# Patient Record
Sex: Female | Born: 1955 | Race: Black or African American | Hispanic: No | Marital: Single | Smoking: Current every day smoker
Health system: Southern US, Community
[De-identification: ages and names within clinical notes are randomized; demographics above are authoritative.]

---

## 2002-04-23 ENCOUNTER — Emergency Department (HOSPITAL_COMMUNITY): Admission: EM | Admit: 2002-04-23 | Discharge: 2002-04-23 | Payer: Self-pay | Admitting: Emergency Medicine

## 2002-04-24 ENCOUNTER — Encounter: Admission: RE | Admit: 2002-04-24 | Discharge: 2002-04-24 | Payer: Self-pay | Admitting: Emergency Medicine

## 2002-04-24 ENCOUNTER — Encounter: Payer: Self-pay | Admitting: Emergency Medicine

## 2006-05-27 ENCOUNTER — Emergency Department (HOSPITAL_COMMUNITY): Admission: EM | Admit: 2006-05-27 | Discharge: 2006-05-27 | Payer: Self-pay | Admitting: Emergency Medicine

## 2006-09-05 ENCOUNTER — Emergency Department (HOSPITAL_COMMUNITY): Admission: EM | Admit: 2006-09-05 | Discharge: 2006-09-05 | Payer: Self-pay | Admitting: Emergency Medicine

## 2008-08-13 ENCOUNTER — Encounter: Admission: RE | Admit: 2008-08-13 | Discharge: 2008-08-13 | Payer: Self-pay | Admitting: Infectious Diseases

## 2008-10-20 ENCOUNTER — Ambulatory Visit: Payer: Self-pay | Admitting: Obstetrics & Gynecology

## 2008-10-20 ENCOUNTER — Other Ambulatory Visit: Admission: RE | Admit: 2008-10-20 | Discharge: 2008-10-20 | Payer: Self-pay | Admitting: Obstetrics & Gynecology

## 2008-10-21 ENCOUNTER — Encounter: Payer: Self-pay | Admitting: Obstetrics & Gynecology

## 2008-10-21 LAB — CONVERTED CEMR LAB
Chlamydia, DNA Probe: NEGATIVE
FSH: 50.1 milliintl units/mL
GC Probe Amp, Genital: NEGATIVE
HCT: 26.7 % — ABNORMAL LOW (ref 36.0–46.0)
HCV Ab: NEGATIVE
Hemoglobin: 8.4 g/dL — ABNORMAL LOW (ref 12.0–15.0)
Hepatitis B Surface Ag: NEGATIVE
MCHC: 31.5 g/dL (ref 30.0–36.0)
MCV: 85.6 fL (ref 78.0–100.0)
Platelets: 394 10*3/uL (ref 150–400)
Prolactin: 9.9 ng/mL
RBC: 3.12 M/uL — ABNORMAL LOW (ref 3.87–5.11)
RDW: 18 % — ABNORMAL HIGH (ref 11.5–15.5)
TSH: 2.161 microintl units/mL (ref 0.350–4.500)
WBC: 8.7 10*3/uL (ref 4.0–10.5)
hCG, Beta Chain, Quant, S: <2 milliintl units/mL

## 2008-10-26 ENCOUNTER — Ambulatory Visit (HOSPITAL_COMMUNITY): Admission: RE | Admit: 2008-10-26 | Discharge: 2008-10-26 | Payer: Self-pay | Admitting: Obstetrics & Gynecology

## 2010-02-28 IMAGING — US US PELVIS COMPLETE MODIFY
1 series · 14 of 25 positions shown · non-contrast
Comparison: None

CLINICAL DATA: Abnormal uterine bleeding.  Fibroids.  LMP
09/24/2008

TRANSABDOMINAL AND TRANSVAGINAL ULTRASOUND OF PELVIS
TECHNIQUE: Both transabdominal and transvaginal ultrasound
examinations of the pelvis were performed including evaluation of
the uterus, ovaries, adnexal regions, and pelvic cul-de-sac.

[Series 1: us pelvis complete modify · 0.27mm/px · 14 of 43 slices shown]
[im 1/43]
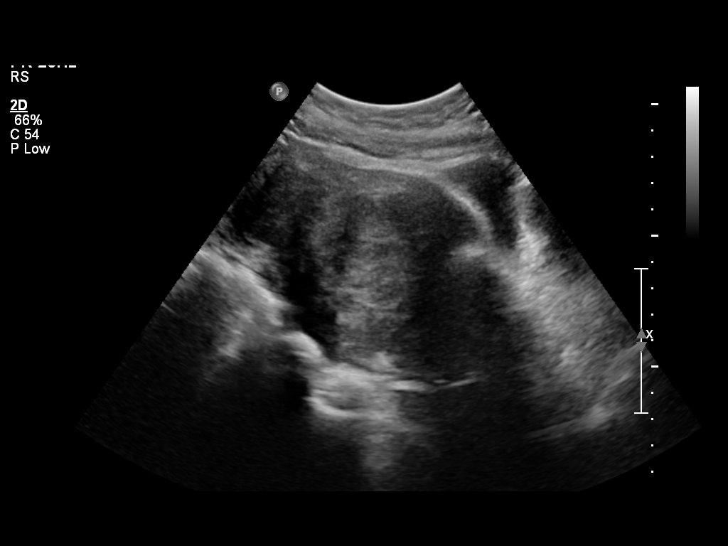
[im 4/43]
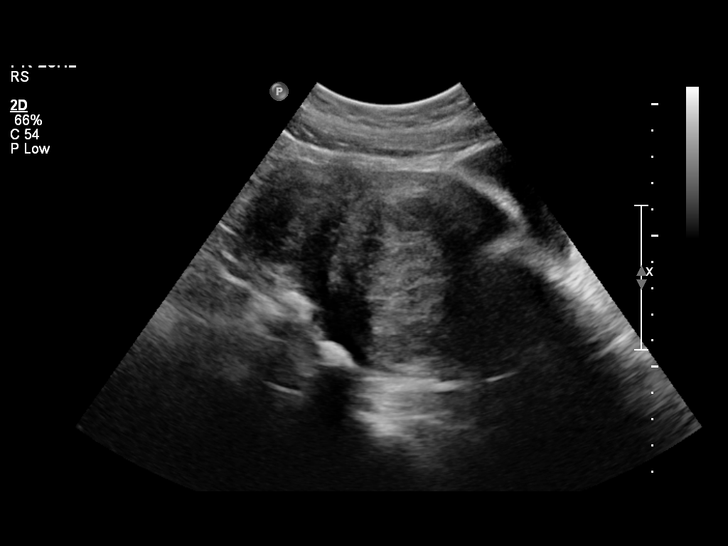
[im 8/43]
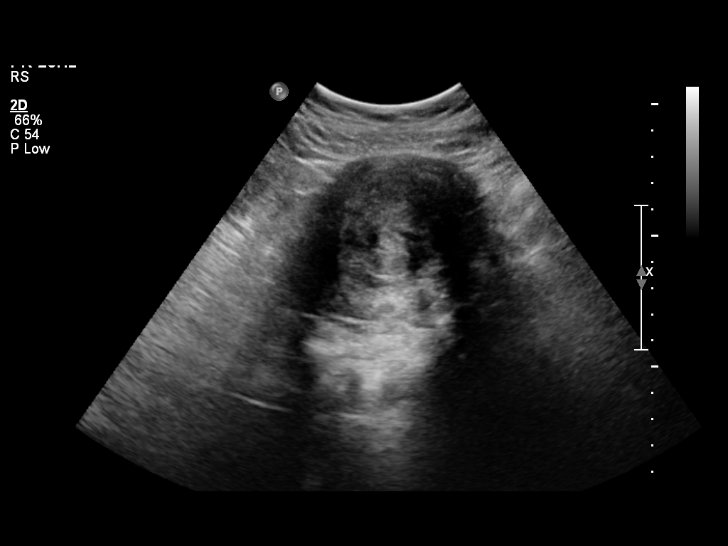
[im 11/43]
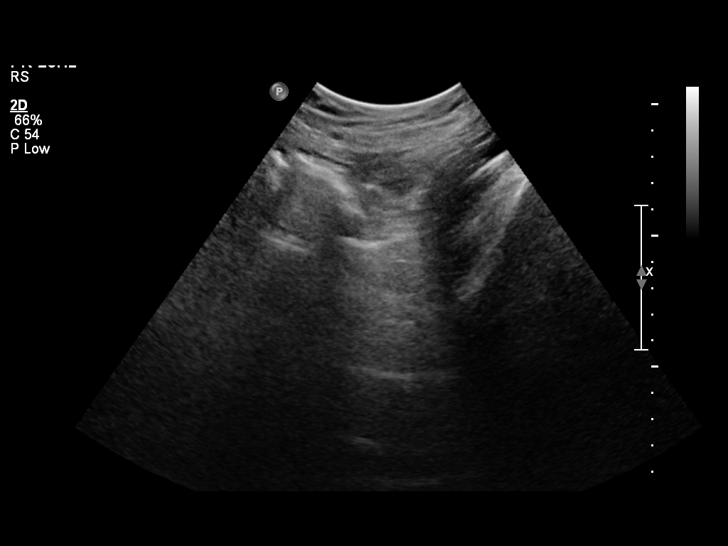
[im 15/43]
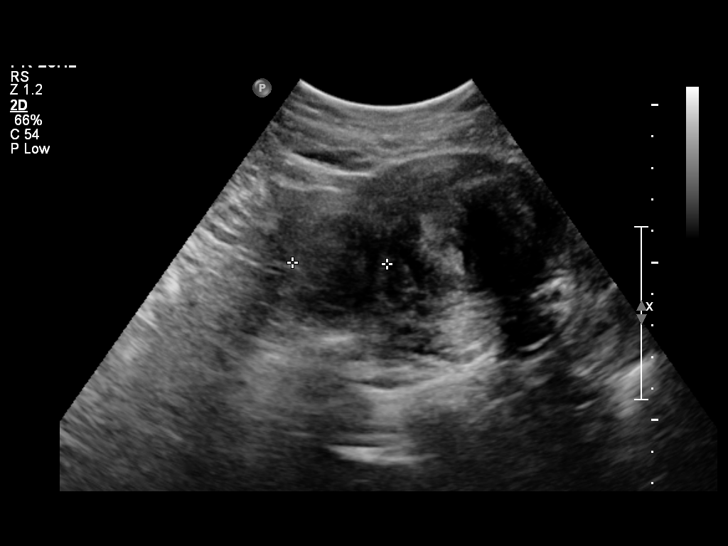
[im 16/43]
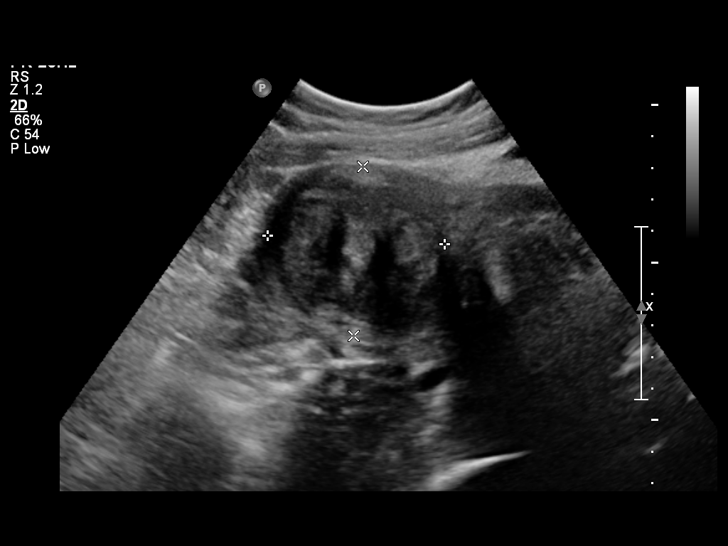
[im 20/43]
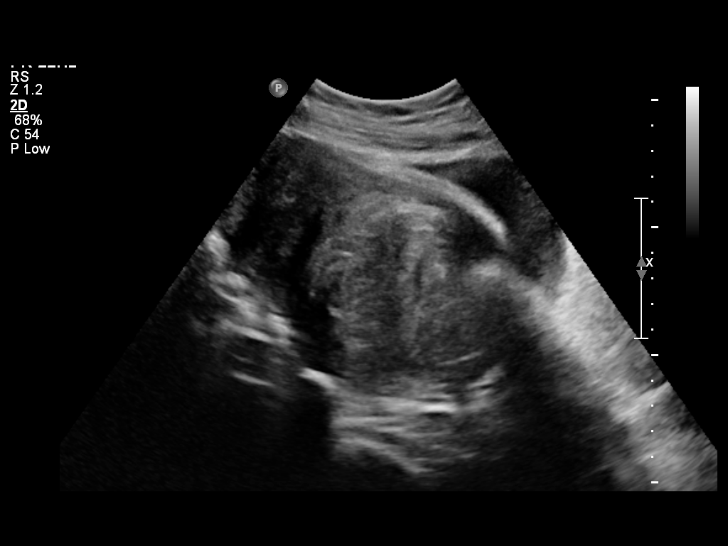
[im 23/43]
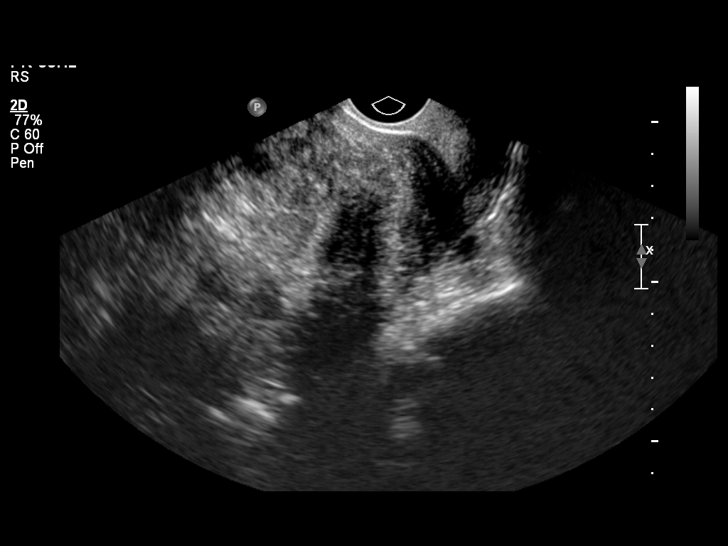
[im 27/43]
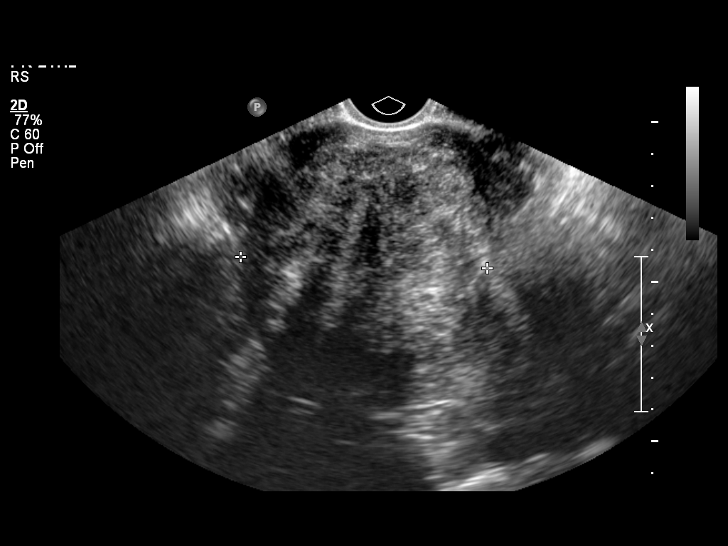
[im 29/43]
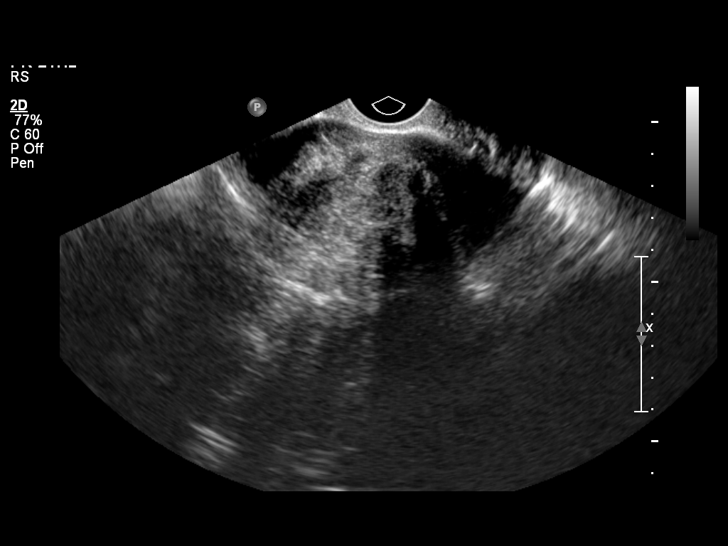
[im 32/43]
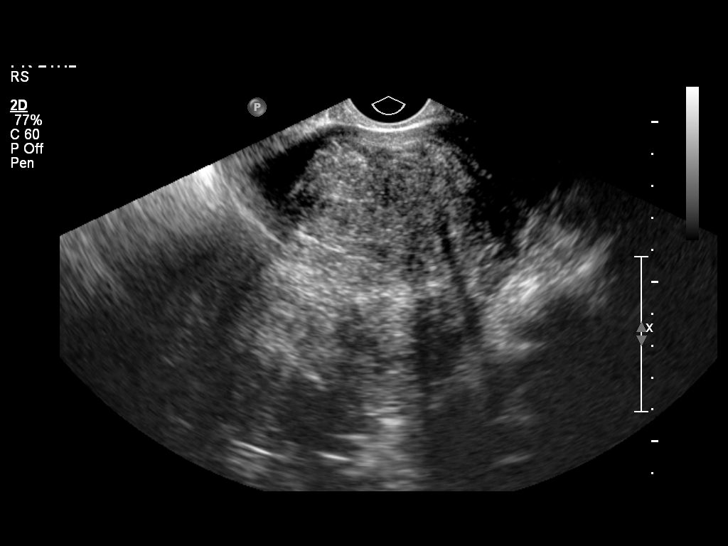
[im 36/43]
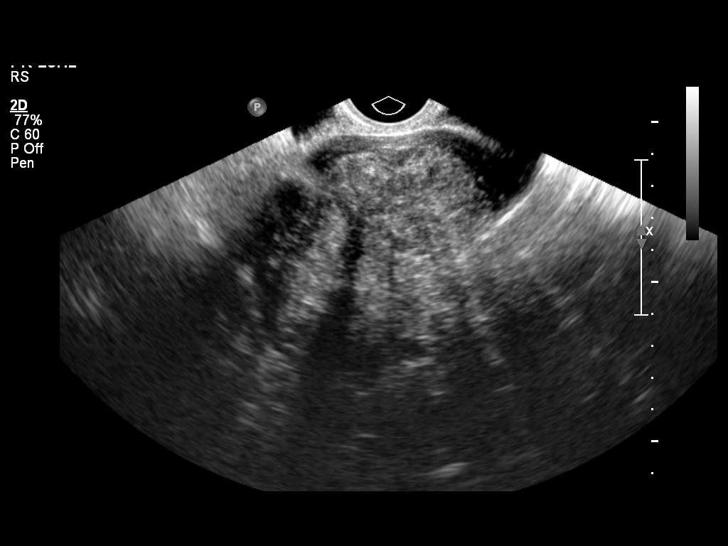
[im 39/43]
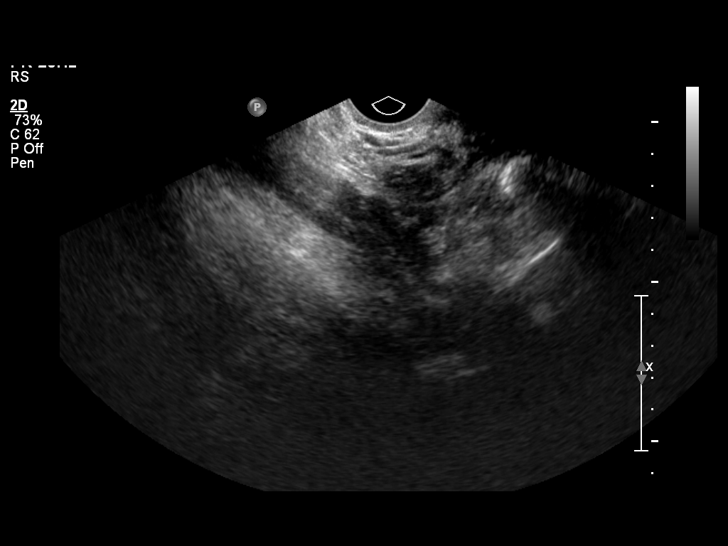
[im 43/43]
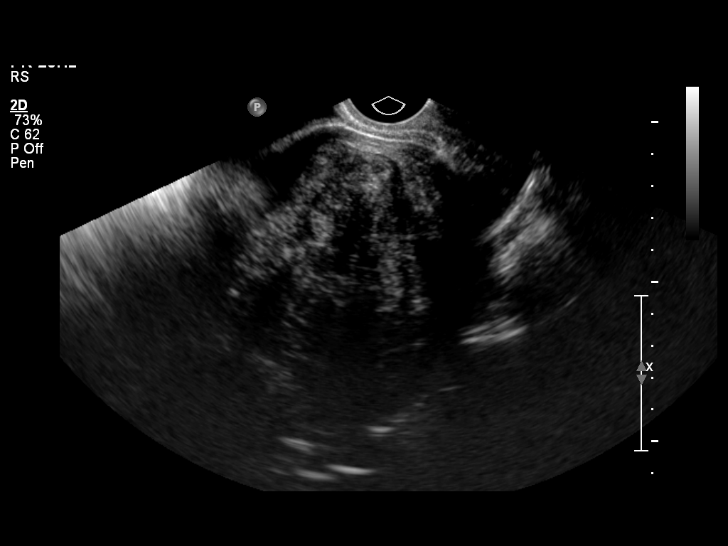

[14 of 25 positions shown; findings below may reference images not displayed]

FINDINGS: Uterus:  Overall measurements are 13.4 x 7.2 x 7.7 cm.  The uterus
is diffusely involved by fibroids, with at least three separate
distinct fibroids visualized.  These measure 3.6 cm, 6.0 cm, and
6.8 cm and maximum diameters.

Endometrium:  Not visualized due to fibroids.

Right Ovary:  Not directly visualized.

Left Ovary:  Not directly visualized.

Other Findings:  No adnexal mass or free fluid identified.
IMPRESSION: 1.  Moderately enlarged uteruswith at least three discrete
fibroids, measuring up to 6.8 cm.
2.  Nonvisualization of endometrium and ovaries due to fibroids.
No adnexal mass or free fluid identified.

## 2010-07-16 ENCOUNTER — Encounter: Payer: Self-pay | Admitting: General Surgery

## 2010-11-07 NOTE — Group Therapy Note (Signed)
Meredith Knight, Meredith Knight                 ACCOUNT NO.:  [id number]   MEDICAL RECORD NO.:  [id number]          PATIENT TYPE:  WOC   LOCATION:  [facility]                   FACILITY:  [facility]   PHYSICIAN:  Johnella Moloney, MD        DATE OF BIRTH:  [birth year]   DATE OF SERVICE:  10/20/2008                                  CLINIC NOTE   CHIEF COMPLAINT:  Menometrorrhagia.   HISTORY OF PRESENT ILLNESS:  The patient is a 55 year old gravida 3,  para 1-0-2-1 who was referred from the [facility]  for evaluation of menometrorrhagia and enlarged uterus.  The patient  reports menarche at age 78 with regular cycles until 6 months ago when  she started having the episode of having 5 days of very heavy bleeding  and very big clots and no bleeding for a couple of days, then light  bleeding and then another episode of heavy bleeding which could come at  irregular intervals.  She said she does have an episode of heavy  bleeding at least two to three times a month since the last 6 months.  Her bleeding is associated with abdominal cramping and pain.  She has  also noted hot flashes and night sweats which have become increasingly  bothersome.  The patient also reports having some dizziness, denies any  syncopal episodes, or any other symptoms.  The patient has not had any  laboratory, radiologic or any other evaluation for this bleeding.  [facility] and was  normal according to the patient.   PAST OBSTETRIC/GYNECOLOGIC HISTORY:  The patient has had one vaginal  delivery, one miscarriage and one termination.  Menstrual history is as  above.  The patient does have a history of Trichomonas that was recently  diagnosed and treated and according to her she was tested again for it  and was negative.  The patient denies any abnormal Pap smear history.  Her last Pap smear was in February 2010 and her last mammogram was also  in February 2010  and was normal.   PAST MEDICAL HISTORY:  Bilateral breast acid abscesses that needed to be  drained.   SURGICAL HISTORY:  Drainage of breast abscess of the breast center at  [facility].   MEDICATIONS:  None.   ALLERGIES:  No known drug allergies.  The patient is not allergic to  latex.   SOCIAL HISTORY:  The patient lives with her sister.  She is currently  unemployed.  She smokes one pack of cigarettes per week and has smoked  for 25 years.  She drinks four beers a week.  She denies any illicit or  IV drug use.  She also denies any current or past history of sexual  physical abuse.   FAMILIAL HISTORY:  Entirely negative.  No gynecologic or breast cancer.   REVIEW OF SYSTEMS:  The patient endorses bruising, night sweats, weight  gain, frequent headaches, dizzy spells, nausea, vomiting, rare loss of  urine with coughing and sneezing and hot flashes.   PHYSICAL EXAMINATION:  Temperature 97.4, pulse 73, respirations 16,  blood pressure 133/85, weight 171 pounds, height 62 inches.  IN GENERAL:  No apparent distress.  LUNGS:  Clear to auscultation bilaterally.  HEART:  Regular rate and rhythm.  ABDOMEN:  Soft, fibroid uterus palpated at umbilicus.  EXTREMITIES:  No cyanosis, clubbing or edema.  PELVIC EXAM:  Normal external female genitalia.  Pink vagina with some  loss of rugae.  Small amount of old blood noted in vaginal vault.  Cervix is multiparous.  On bimanual exam the patient has about a 20-week  size fibroid uterus, small amount of tenderness on palpation.  Adnexa  are unable to be palpated.   Endometrial biopsy:  The patient was counseled regarding need for  endometrial biopsy given her menometrorrhagia and her age.  The risks of  biopsy were reviewed and written informed consent was obtained.  A urine  pregnancy test that was done was negative.   During the pelvic examination the patient initially had a swab obtained  for gonorrhea and chlamydia as per the patient's  request for a full STD  screen given her recent diagnosis of Trichomonas.  Her cervix was then  swabbed with Betadine x2 and a tenaculum was placed in the anterior  cervical lip.  A 20 mm Pipelle was then advanced into the uterine  fundus.  I was unable to advance it past 7 mm secondary to probably a  fibroid obstruction.  A suction was then created in the Pipelle and it  was slowly rotated to obtain some endometrial tissue.  Moderate  endometrial tissue  was obtained.  Only one pass was made and the tissue  was sent off to Pathology.  There was minimal amount of bright red blood  at the end of the biopsy and the patient tolerated procedure well.  All  instruments were removed from the patient's pelvis.   ASSESSMENT/PLAN:  The patient is a 55 year old gravida 3, para 1-0-2-1  here for menometrorrhagia.  The patient has an enlarged uterus on  examination.  Most likely fibroids, but we will get an ultrasound for  further evaluation.  The patient will also have tested her TSH,  prolactin and FSH levels given her symptoms and also for further workup  for her menometrorrhagia.  The patient also requests a full sexually  transmitted disease screen, which will be done at the same time to  evaluate for HIV, hepatitis B, hepatitis C and RPR.  The patient will  follow up in 2 weeks for evaluation of all these results and results of  the endometrial biopsy.  At this visit we will proceed with management  for her menorrhagia of course based on her ultrasound findings.  The  possible modalities that will be offered to the patient include ablation  if she is a candidate for that or hysterectomy.           ______________________________  Johnella Moloney, MD     UD/MEDQ  D:  10/20/2008  T:  10/20/2008  Job:  [id number]

## 2010-11-10 NOTE — Consult Note (Signed)
Meredith Knight, Meredith Knight                 ACCOUNT NO.:  [id number]   MEDICAL RECORD NO.:  [id number]          PATIENT TYPE:  EMS   LOCATION:  ED                           FACILITY:  [facility]   PHYSICIAN:  Angelia Mould. Derrell Lolling, M.D.DATE OF BIRTH:  01/07/1956   DATE OF CONSULTATION:  09/05/2006  DATE OF DISCHARGE:  09/05/2006                                 CONSULTATION   REASON FOR CONSULTATION:  Evaluate right breast infection.   HISTORY OF PRESENT ILLNESS:  This is a 55 year old black female who  states that she has had pain and swelling in her right breast for 2  weeks, but has not sought medical attention until today.  It began to  drain yellow foul-smelling fluid, about 36-48 hours ago.  She denies any  history of trauma to this breast or any prior problems with this breast.  She went to [facility] today.  I was called.  She was transferred  to the [facility] emergency department for my evaluation.  She states  that she has not had any mammograms in several years.   PAST HISTORY:  She had a left breast abscess 3 or 4 months ago,  underwent incision and drainage possibly in the [facility] office,  but she does not remember the doctor's name.  She had some packing and  it healed completely, and that breast feels fine now.  She states that  she is healthy.  She denies having hypertension, diabetes or sickle cell  disease.   MEDICATIONS:  None.   DRUG ALLERGIES:  NONE.   SOCIAL HISTORY:  She states that she used to be addicted to cocaine  but is not now.  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  Does smoke cigarettes, drinks alcohol  occasionally.   FAMILY HISTORY:  Noncontributory.   REVIEW OF SYSTEMS:  All systems are reviewed.  A 15-system review  noncontributory, except as described above.   PHYSICAL EXAMINATION:  GENERAL:  Alert, oriented but very nervous,  anxious and fidgety and poorly cooperative black female in mild  distress.  VITAL SIGNS:  Temp 97.9, blood pressure 114/68, heart rate 84 and  regular, respiratory rate 18.  NECK:  No adenopathy or mass.  No jugular distension.  LUNGS:  Clear to auscultation.  No chest wall tenderness.  BREASTS:  The left breast shows some scars in the periareolar area which  are soft and well-healed, no tenderness.  No breast mass on the left.  Right breast shows an area of necrotic skin at the areolar margin,  medially at the 3 o'clock position.  There is about a 5-cm area of  induration and low grade cellulitis around this.  There is no purulence  that I can detect.  I do not detect a specific mass, other than the  induration.   PROCEDURE:  The right breast was prepped with Betadine and draped out.  One percent Xylocaine with epinephrine was used as a local infiltration  anesthetic.  I debrided the area of necrotic skin at the  areolar margin  medially, approximately 2 cm in diameter.  I then digitally explored the  wound and found a chronic abscess cavity.  This was cultured.  I washed  the wound out with saline.  I did not feel that any further debridement  was necessary.  I packed the wound loosely with 1/2-inch Iodoform gauze.  A clean bandage was placed.  The patient tolerated the procedure well  but was somewhat tearful.   DATA:  A CBC reveals a white blood cell count of 8,900 and hemoglobin of  9.7.   ASSESSMENT:  1. Right breast abscess, periareolar, the 3 o'clock position, delayed      presentation somewhat neglected.  2. History of left breast abscess, healed.  3. History of cocaine abuse, per the patient's history.  4. Tobacco abuse.  5. Anxiety.  6. Anemia.   PLAN:  1. The patient was advised in wound care, which is to remove the      packing tomorrow morning and to take a shower twice a day and to      cover externally with dry white gauze.  No ointments or salves.  2. Doxycycline 100 mg p.o. b.i.d. x10 days.  3. Vicodin 20 tablets p.r.n.  pain.  4. She is advised to return to see me in the office in 10 to 14 days.  5. She is advised that she must have mammograms to rule out other      breast disease, once this is healed.  She expresses understanding      of all this and states that she will comply.      Angelia Mould. Derrell Lolling, M.D.  Electronically Signed     HMI/MEDQ  D:  09/05/2006  T:  09/07/2006  Job:  [id number]

## 2014-10-11 ENCOUNTER — Emergency Department (HOSPITAL_COMMUNITY)
Admission: EM | Admit: 2014-10-11 | Discharge: 2014-10-12 | Disposition: A | Discharge status: Home / Self Care | Payer: Self-pay | Attending: Emergency Medicine | Admitting: Emergency Medicine

## 2014-10-11 ENCOUNTER — Encounter (HOSPITAL_COMMUNITY): Payer: Self-pay | Admitting: Emergency Medicine

## 2014-10-11 DIAGNOSIS — K2970 Gastritis, unspecified, without bleeding: Secondary | ICD-10-CM | POA: Insufficient documentation

## 2014-10-11 DIAGNOSIS — Z3202 Encounter for pregnancy test, result negative: Secondary | ICD-10-CM | POA: Insufficient documentation

## 2014-10-11 DIAGNOSIS — R197 Diarrhea, unspecified: Secondary | ICD-10-CM

## 2014-10-11 DIAGNOSIS — K529 Noninfective gastroenteritis and colitis, unspecified: Secondary | ICD-10-CM | POA: Insufficient documentation

## 2014-10-11 DIAGNOSIS — E876 Hypokalemia: Secondary | ICD-10-CM | POA: Insufficient documentation

## 2014-10-11 DIAGNOSIS — Z720 Tobacco use: Secondary | ICD-10-CM | POA: Insufficient documentation

## 2014-10-11 DIAGNOSIS — R112 Nausea with vomiting, unspecified: Secondary | ICD-10-CM

## 2014-10-11 DIAGNOSIS — R1013 Epigastric pain: Secondary | ICD-10-CM

## 2014-10-11 LAB — CBC WITH DIFFERENTIAL/PLATELET
BASOS PCT: 0 % (ref 0–1)
Basophils Absolute: 0 10*3/uL (ref 0.0–0.1)
EOS ABS: 0 10*3/uL (ref 0.0–0.7)
Eosinophils Relative: 0 % (ref 0–5)
HEMATOCRIT: 37.5 % (ref 36.0–46.0)
Hemoglobin: 12 g/dL (ref 12.0–15.0)
Lymphocytes Relative: 13 % (ref 12–46)
Lymphs Abs: 1.2 10*3/uL (ref 0.7–4.0)
MCH: 28.1 pg (ref 26.0–34.0)
MCHC: 32 g/dL (ref 30.0–36.0)
MCV: 87.8 fL (ref 78.0–100.0)
MONO ABS: 0.6 10*3/uL (ref 0.1–1.0)
MONOS PCT: 6 % (ref 3–12)
NEUTROS PCT: 81 % — AB (ref 43–77)
Neutro Abs: 7.4 10*3/uL (ref 1.7–7.7)
Platelets: 306 10*3/uL (ref 150–400)
RBC: 4.27 MIL/uL (ref 3.87–5.11)
RDW: 16.3 % — AB (ref 11.5–15.5)
WBC: 9.1 10*3/uL (ref 4.0–10.5)

## 2014-10-11 LAB — URINE MICROSCOPIC-ADD ON

## 2014-10-11 LAB — COMPREHENSIVE METABOLIC PANEL
ALBUMIN: 4.1 g/dL (ref 3.5–5.2)
ALK PHOS: 106 U/L (ref 39–117)
ALT: 18 U/L (ref 0–35)
AST: 21 U/L (ref 0–37)
Anion gap: 11 (ref 5–15)
BUN: 11 mg/dL (ref 6–23)
CALCIUM: 9.8 mg/dL (ref 8.4–10.5)
CO2: 26 mmol/L (ref 19–32)
Chloride: 100 mmol/L (ref 96–112)
Creatinine, Ser: 0.88 mg/dL (ref 0.50–1.10)
GFR calc Af Amer: 83 mL/min — ABNORMAL LOW (ref >90)
GFR calc non Af Amer: 72 mL/min — ABNORMAL LOW (ref >90)
GLUCOSE: 128 mg/dL — AB (ref 70–99)
POTASSIUM: 3.4 mmol/L — AB (ref 3.5–5.1)
SODIUM: 137 mmol/L (ref 135–145)
Total Bilirubin: 0.9 mg/dL (ref 0.3–1.2)
Total Protein: 8.6 g/dL — ABNORMAL HIGH (ref 6.0–8.3)

## 2014-10-11 LAB — URINALYSIS, ROUTINE W REFLEX MICROSCOPIC
Bilirubin Urine: NEGATIVE
GLUCOSE, UA: NEGATIVE mg/dL
Ketones, ur: 40 mg/dL — AB
Nitrite: NEGATIVE
PROTEIN: 30 mg/dL — AB
SPECIFIC GRAVITY, URINE: 1.021 (ref 1.005–1.030)
Urobilinogen, UA: 1 mg/dL (ref 0.0–1.0)
pH: 8.5 — ABNORMAL HIGH (ref 5.0–8.0)

## 2014-10-11 LAB — POC URINE PREG, ED: PREG TEST UR: NEGATIVE

## 2014-10-11 LAB — LIPASE, BLOOD: Lipase: 22 U/L (ref 11–59)

## 2014-10-11 LAB — I-STAT TROPONIN, ED: TROPONIN I, POC: 0 ng/mL (ref 0.00–0.08)

## 2014-10-11 MED ORDER — MORPHINE SULFATE 4 MG/ML IJ SOLN
4.0000 mg | Freq: Once | INTRAMUSCULAR | Status: AC
  Administered 2014-10-11: 4 mg via INTRAVENOUS
  Filled 2014-10-11: qty 1

## 2014-10-11 MED ORDER — GI COCKTAIL ~~LOC~~
30.0000 mL | Freq: Once | ORAL | Status: AC
  Administered 2014-10-11: 30 mL via ORAL
  Filled 2014-10-11: qty 30

## 2014-10-11 MED ORDER — PANTOPRAZOLE SODIUM 40 MG IV SOLR
40.0000 mg | Freq: Once | INTRAVENOUS | Status: AC
  Administered 2014-10-11: 40 mg via INTRAVENOUS
  Filled 2014-10-11: qty 40

## 2014-10-11 MED ORDER — ONDANSETRON HCL 4 MG/2ML IJ SOLN
4.0000 mg | Freq: Once | INTRAMUSCULAR | Status: AC
  Administered 2014-10-11: 4 mg via INTRAVENOUS
  Filled 2014-10-11: qty 2

## 2014-10-11 MED ORDER — SODIUM CHLORIDE 0.9 % IV BOLUS (SEPSIS)
500.0000 mL | Freq: Once | INTRAVENOUS | Status: AC
  Administered 2014-10-11: 500 mL via INTRAVENOUS

## 2014-10-11 MED ORDER — POTASSIUM CHLORIDE CRYS ER 20 MEQ PO TBCR
40.0000 meq | EXTENDED_RELEASE_TABLET | Freq: Once | ORAL | Status: AC
  Administered 2014-10-12: 40 meq via ORAL
  Filled 2014-10-11: qty 2

## 2014-10-11 NOTE — ED Notes (Addendum)
Pt states diarrhea, nausea and a "pulling" in epigastric area since yesterday. Reports taking tylenol and mylanta with no relief.  Pt not actively vomiting at this time. Pt alert, oriented, nad.

## 2014-10-11 NOTE — ED Notes (Signed)
Pt. reports intermittent epigastric pain with emesis and diarrhea onset yesterday , denies fever or chills, no SOB or diaphoresis .

## 2014-10-11 NOTE — ED Provider Notes (Signed)
CSN: [id number]     Arrival date & time 10/11/14  1912 History   First MD Initiated Contact with Patient 10/11/14 2228     Chief Complaint  Patient presents with  . Abdominal Pain     (Consider location/radiation/quality/duration/timing/severity/associated sxs/prior Treatment) HPI Comments: Meredith Knight is a 59 y.o. Female who presents to the ED with complaints of epigastric pain 1 day. She describes the pain as 7/10 squeezing/pulling pain located in the epigastrium, nonradiating, constant, with no known aggravating factors, and unrelieved with Mylanta and Tylenol. She endorses associated nausea and vomiting, stating that she had 3 episodes of nonbloody nonbilious emesis today and 6 episodes yesterday. Additionally she states she had 2 episodes of watery diarrhea yesterday and none today. She denies any fevers, chills, chest pain, shortness breath, constipation, obstipation, melena, hematochezia, hematemesis, dysuria, hematuria, increased urinary frequency, flank pain, vaginal discharge, numbness, tingling, weakness, rashes, myalgias, arthralgias, suspicious food intake, recent travel, antibiotic use, or chronic NSAIDs use. She admits to having sick contacts at home, stating that her brother has similar symptoms. She also admits to drinking two 40's of beer 2 days ago, and states she drinks "every 3 days or so". She is currently on her menses. Denies hx of nephrolithiasis, GI conditions, or cardiac disease.   Patient is a 59 y.o. female presenting with abdominal pain. The history is provided by the patient. No language interpreter was used.  Abdominal Pain Pain location:  Epigastric Pain quality: squeezing   Pain radiates to:  Does not radiate Pain severity:  Moderate Onset quality:  Gradual Duration:  1 day Timing:  Constant Progression:  Unchanged Chronicity:  New Context: sick contacts   Context: not recent illness, not recent travel and not suspicious food intake   Relieved by:   Nothing Worsened by:  Nothing tried Ineffective treatments:  Antacids and acetaminophen (mylanta and tylenol) Associated symptoms: diarrhea (2x yesterday, watery), nausea, vaginal bleeding (on menses) and vomiting (3x today, 6x yesterday, NBNB)   Associated symptoms: no chest pain, no chills, no constipation, no dysuria, no fever, no flatus, no hematemesis, no hematochezia, no hematuria, no melena, no shortness of breath and no vaginal discharge   Diarrhea:    Quality:  Watery   Number of occurrences:  2x yesterday, none today   Severity:  Mild   Duration:  1 day   Timing:  Intermittent   Progression:  Unchanged Vomiting:    Quality:  Stomach contents   Number of occurrences:  6x yesterday, 3x today   Severity:  Moderate   Duration:  1 day   Timing:  Constant   Progression:  Unchanged Risk factors: alcohol abuse (drinks "every 3 days")   Risk factors: no NSAID use     History reviewed. No pertinent past medical history. History reviewed. No pertinent past surgical history. No family history on file. History  Substance Use Topics  . Smoking status: Current Every Day Smoker  . Smokeless tobacco: Not on file  . Alcohol Use: Yes   OB History    No data available     Review of Systems  Constitutional: Negative for fever and chills.  Respiratory: Negative for shortness of breath.   Cardiovascular: Negative for chest pain.  Gastrointestinal: Positive for nausea, vomiting (3x today, 6x yesterday, NBNB), abdominal pain and diarrhea (2x yesterday, watery). Negative for constipation, blood in stool, melena, hematochezia, flatus and hematemesis.  Genitourinary: Positive for vaginal bleeding (on menses). Negative for dysuria, frequency, hematuria, flank pain, vaginal discharge and menstrual  problem.  Musculoskeletal: Negative for myalgias, back pain and arthralgias.  Skin: Negative for rash.  Allergic/Immunologic: Negative for immunocompromised state.  Neurological: Negative for  weakness and numbness.  Psychiatric/Behavioral: Negative for confusion.   10 Systems reviewed and are negative for acute change except as noted in the HPI.    Allergies  Review of patient's allergies indicates no known allergies.  Home Medications   Prior to Admission medications   Not on File   BP 143/88 mmHg  Pulse 70  Temp(Src) 98.2 F (36.8 C) (Oral)  Resp 16  Ht  (1.575 m)  Wt 164 lb 1.6 oz (74.435 kg)  BMI 30.01 kg/m2  SpO2 99%  LMP 10/08/2014 Physical Exam  Constitutional: She is oriented to person, place, and time. Vital signs are normal. She appears well-developed and well-nourished.  Non-toxic appearance. No distress.  Afebrile, nontoxic, NAD  HENT:  Head: Normocephalic and atraumatic.  Mouth/Throat: Oropharynx is clear and moist. Mucous membranes are dry (mildly).  Mildly dry mucous membranes  Eyes: Conjunctivae and EOM are normal. Right eye exhibits no discharge. Left eye exhibits no discharge.  Neck: Normal range of motion. Neck supple.  Cardiovascular: Normal rate, regular rhythm, normal heart sounds and intact distal pulses.  Exam reveals no gallop and no friction rub.   No murmur heard. Pulmonary/Chest: Effort normal and breath sounds normal. No respiratory distress. She has no decreased breath sounds. She has no wheezes. She has no rhonchi. She has no rales.  Abdominal: Soft. Normal appearance and bowel sounds are normal. She exhibits no distension. There is tenderness in the epigastric area. There is no rigidity, no rebound, no guarding, no CVA tenderness, no tenderness at McBurney's point and negative Murphy's sign.    Soft, nondistended, +BS throughout, with epigastric TTP, no r/g/r, neg murphy's, neg mcburney's, no CVA TTP   Musculoskeletal: Normal range of motion.  Neurological: She is alert and oriented to person, place, and time. She has normal strength. No sensory deficit.  Skin: Skin is warm, dry and intact. No rash noted.  Psychiatric: She  has a normal mood and affect.  Nursing note and vitals reviewed.   ED Course  Procedures (including critical care time) Labs Review Labs Reviewed  CBC WITH DIFFERENTIAL/PLATELET - Abnormal; Notable for the following:    RDW 16.3 (*)    Neutrophils Relative % 81 (*)    All other components within normal limits  COMPREHENSIVE METABOLIC PANEL - Abnormal; Notable for the following:    Potassium 3.4 (*)    Glucose, Bld 128 (*)    Total Protein 8.6 (*)    GFR calc non Af Amer 72 (*)    GFR calc Af Amer 83 (*)    All other components within normal limits  URINALYSIS, ROUTINE W REFLEX MICROSCOPIC - Abnormal; Notable for the following:    Color, Urine AMBER (*)    APPearance TURBID (*)    pH 8.5 (*)    Hgb urine dipstick LARGE (*)    Ketones, ur 40 (*)    Protein, ur 30 (*)    Leukocytes, UA SMALL (*)    All other components within normal limits  URINE MICROSCOPIC-ADD ON - Abnormal; Notable for the following:    Squamous Epithelial / LPF MANY (*)    Bacteria, UA FEW (*)    Crystals TRIPLE PHOSPHATE CRYSTALS (*)    All other components within normal limits  LIPASE, BLOOD  I-STAT TROPOININ, ED  POC URINE PREG, ED    Imaging  Review No results found.   EKG Interpretation   Date/Time:  Monday October 11 2014 23:59:00 EDT Ventricular Rate:  61 PR Interval:  127 QRS Duration: 87 QT Interval:  403 QTC Calculation: 406 R Axis:   37 Text Interpretation:  Sinus rhythm Borderline T abnormalities, diffuse  leads Since last tracing T wave abnormality have mostly resolved  Borderline ECG Confirmed by MILLER  MD, BRIAN ([id number]) on 10/12/2014  12:20:38 AM       EKG interpretation (#1) Date/Time:  Monday October 11 2014 19:25:51 EDT Ventricular Rate:  67 PR Interval:  122 QRS Duration: 82 QT Interval:  372 QTC Calculation: 393 R Axis:   43 Text Interpretation:  Normal sinus rhythm ST \\T \ T wave abnormality,  consider inferior ischemia Abnormal ECG Since last tracing Nonspecific T    wave abnormality now prsent. Confirmed by Hyacinth Meeker  MD, BRIAN ([id number]) on  10/11/2014 10:32:48 PM  MDM   Final diagnoses:  Epigastric abdominal pain  Gastroenteritis  Gastritis  Nausea vomiting and diarrhea  Hypokalemia    59 y.o. female here with epigastric pain, n/v/d x1 day. +Sick contacts at home. U/A contaminated (pt on menses), no symptoms of UTI, likely just contaminated catch. Upreg neg. Trop neg. CBC w/diff unremarkable. CMP showing mildly low K at 3.4, will give kdur once pt tolerating PO. Lipase WNL. EKG showing new T wave flattening inferiorly which is new when compared to 2012 EKG. Will proceed with fluids, zofran, morphine, protonix, and GI cocktail. Doubt need for imaging at this time, likely viral. Will repeat EKG once pt more comfortable. Will reassess shortly.   11:56 PM Pain improved, nausea improved. Will proceed with PO challenge, and give Kdur here. Will repeat EKG now that pt is more comfortable.   12:13 AM Repeat EKG improved, T changes improved.  12:42 AM Pt feeling better, tolerating PO well. Will send home with prilosec, zofran, and small supply of norco. Discussed diet modifications for GERD/reflux. Discussed BRAT diet. Will have her f/up with [facility] in 1wk. I explained the diagnosis and have given explicit precautions to return to the ER including for any other new or worsening symptoms. The patient understands and accepts the medical plan as it's been dictated and I have answered their questions. Discharge instructions concerning home care and prescriptions have been given. The patient is STABLE and is discharged to home in good condition.  BP 155/89 mmHg  Pulse 92  Temp(Src) 98.2 F (36.8 C) (Oral)  Resp 22  Ht 5\' 2"  (1.575 m)  Wt 164 lb 1.6 oz (74.435 kg)  BMI 30.01 kg/m2  SpO2 99%  LMP 10/08/2014  Meds ordered this encounter  Medications  . pantoprazole (PROTONIX) injection 40 mg    Sig:   . ondansetron (ZOFRAN) injection 4 mg    Sig:   . morphine  4 MG/ML injection 4 mg    Sig:   . sodium chloride 0.9 % bolus 500 mL    Sig:   . gi cocktail (Maalox,Lidocaine,Donnatal)    Sig:   . potassium chloride SA (K-DUR,KLOR-CON) CR tablet 40 mEq    Sig:   . ondansetron (ZOFRAN) 8 MG tablet    Sig: Take 1 tablet (8 mg total) by mouth every 8 (eight) hours as needed for nausea or vomiting.    Dispense:  10 tablet    Refill:  0  . HYDROcodone-acetaminophen (NORCO) 5-325 MG per tablet    Sig: Take 1 tablet by mouth every 6 (six) hours as needed for  severe pain.    Dispense:  6 tablet    Refill:  0  . omeprazole (PRILOSEC) 20 MG capsule    Sig: Take 1 capsule (20 mg total) by mouth daily.    Dispense:  30 capsule    Refill:  0     Miliani Deike Camprubi-Soms, PA-C 10/12/14 0043  Eber Hong, MD 10/13/14 [phone]

## 2014-10-12 MED ORDER — ONDANSETRON HCL 8 MG PO TABS: 8.0000 mg | ORAL_TABLET | Freq: Three times a day (TID) | ORAL | Status: AC | PRN

## 2014-10-12 MED ORDER — HYDROCODONE-ACETAMINOPHEN 5-325 MG PO TABS: 1.0000 | ORAL_TABLET | Freq: Four times a day (QID) | ORAL | Status: AC | PRN

## 2014-10-12 MED ORDER — OMEPRAZOLE 20 MG PO CPDR: 20.0000 mg | DELAYED_RELEASE_CAPSULE | Freq: Every day | ORAL | Status: AC

## 2014-10-12 NOTE — Discharge Instructions (Signed)
Use zofran as prescribed, as needed for nausea. Stay well hydrated with small sips of fluids throughout the day. Use norco as directed as needed for pain, but don't drive while taking it. Avoid NSAIDs like ibuprofen or aleve. Avoid spicy or acidic foods. Stay upright for 30 minutes after meals, don't lay down immediately after eating. Start taking prilosec as directed for indigestion, you may use zantac or tums over-the-counter for additional relief. Follow a BRAT (banana-rice-applesauce-toast) diet as described below for the next 24-48 hours. The 'BRAT' diet is suggested, then progress to diet as tolerated as symptoms abate. Call if bloody stools, persistent diarrhea, vomiting, fever or abdominal pain. Follow up with [place] [facility] in 1 week for recheck of symptoms. Return to ER for changing or worsening of symptoms.  Food Choices to Help Relieve Diarrhea When you have diarrhea, the foods you eat and your eating habits are very important. Choosing the right foods and drinks can help relieve diarrhea. Also, because diarrhea can last up to 7 days, you need to replace lost fluids and electrolytes (such as sodium, potassium, and chloride) in order to help prevent dehydration.  WHAT GENERAL GUIDELINES DO I NEED TO FOLLOW?  Slowly drink 1 cup (8 oz) of fluid for each episode of diarrhea. If you are getting enough fluid, your urine will be clear or pale yellow.  Eat starchy foods. Some good choices include white rice, white toast, pasta, low-fiber cereal, baked potatoes (without the skin), saltine crackers, and bagels.  Avoid large servings of any cooked vegetables.  Limit fruit to two servings per day. A serving is  cup or 1 small piece.  Choose foods with less than 2 g of fiber per serving.  Limit fats to less than 8 tsp (38 g) per day.  Avoid fried foods.  Eat foods that have probiotics in them. Probiotics can be found in certain dairy products.  Avoid foods and beverages that may  increase the speed at which food moves through the stomach and intestines (gastrointestinal tract). Things to avoid include:  High-fiber foods, such as dried fruit, raw fruits and vegetables, nuts, seeds, and whole grain foods.  Spicy foods and high-fat foods.  Foods and beverages sweetened with high-fructose corn syrup, honey, or sugar alcohols such as xylitol, sorbitol, and mannitol. WHAT FOODS ARE RECOMMENDED? Grains White rice. White, [place], or pita breads (fresh or toasted), including plain rolls, buns, or bagels. White pasta. Saltine, soda, or graham crackers. Pretzels. Low-fiber cereal. Cooked cereals made with water (such as cornmeal, farina, or cream cereals). Plain muffins. Matzo. Melba toast. Zwieback.  Vegetables Potatoes (without the skin). Strained tomato and vegetable juices. Most well-cooked and canned vegetables without seeds. Tender lettuce. Fruits Cooked or canned applesauce, apricots, cherries, fruit cocktail, grapefruit, peaches, pears, or plums. Fresh bananas, apples without skin, cherries, grapes, cantaloupe, grapefruit, peaches, oranges, or plums.  Meat and Other Protein Products Baked or boiled chicken. Eggs. Tofu. Fish. Seafood. Smooth peanut butter. Ground or well-cooked tender beef, ham, veal, lamb, pork, or poultry.  Dairy Plain yogurt, kefir, and unsweetened liquid yogurt. Lactose-free milk, buttermilk, or soy milk. Plain hard cheese. Beverages Sport drinks. Clear broths. Diluted fruit juices (except prune). Regular, caffeine-free sodas such as ginger ale. Water. Decaffeinated teas. Oral rehydration solutions. Sugar-free beverages not sweetened with sugar alcohols. Other Bouillon, broth, or soups made from recommended foods.  The items listed above may not be a complete list of recommended foods or beverages. Contact your dietitian for more options. WHAT FOODS ARE  NOT RECOMMENDED? Grains Whole grain, whole wheat, bran, or rye breads, rolls, pastas, crackers,  and cereals. Wild or brown rice. Cereals that contain more than 2 g of fiber per serving. Corn tortillas or taco shells. Cooked or dry oatmeal. Granola. Popcorn. Vegetables Raw vegetables. Cabbage, broccoli, Brussels sprouts, artichokes, baked beans, beet greens, corn, kale, legumes, peas, sweet potatoes, and yams. Potato skins. Cooked spinach and cabbage. Fruits Dried fruit, including raisins and dates. Raw fruits. Stewed or dried prunes. Fresh apples with skin, apricots, mangoes, pears, raspberries, and strawberries.  Meat and Other Protein Products Chunky peanut butter. Nuts and seeds. Beans and lentils. Tomasa Blase.  Dairy High-fat cheeses. Milk, chocolate milk, and beverages made with milk, such as milk shakes. Cream. Ice cream. Sweets and Desserts Sweet rolls, doughnuts, and sweet breads. Pancakes and waffles. Fats and Oils Butter. Cream sauces. Margarine. Salad oils. Plain salad dressings. Olives. Avocados.  Beverages Caffeinated beverages (such as coffee, tea, soda, or energy drinks). Alcoholic beverages. Fruit juices with pulp. Prune juice. Soft drinks sweetened with high-fructose corn syrup or sugar alcohols. Other Coconut. Hot sauce. Chili powder. Mayonnaise. Gravy. Cream-based or milk-based soups.  The items listed above may not be a complete list of foods and beverages to avoid. Contact your dietitian for more information. WHAT SHOULD I DO IF I BECOME DEHYDRATED? Diarrhea can sometimes lead to dehydration. Signs of dehydration include dark urine and dry mouth and skin. If you think you are dehydrated, you should rehydrate with an oral rehydration solution. These solutions can be purchased at pharmacies, retail stores, or online.  Drink -1 cup (120-240 mL) of oral rehydration solution each time you have an episode of diarrhea. If drinking this amount makes your diarrhea worse, try drinking smaller amounts more often. For example, drink 1-3 tsp (5-15 mL) every 5-10 minutes.  A general rule  for staying hydrated is to drink 1-2 L of fluid per day. Talk to your health care provider about the specific amount you should be drinking each day. Drink enough fluids to keep your urine clear or pale yellow. Document Released: 09/01/2003 Document Revised: 06/16/2013 Document Reviewed: 05/04/2013 [facility] Patient Information 2015 [place], [place]. This information is not intended to replace advice given to you by your health care provider. Make sure you discuss any questions you have with your health care provider.   Abdominal Pain, Women Abdominal (stomach, pelvic, or belly) pain can be caused by many things. It is important to tell your doctor:  The location of the pain.  Does it come and go or is it present all the time?  Are there things that start the pain (eating certain foods, exercise)?  Are there other symptoms associated with the pain (fever, nausea, vomiting, diarrhea)? All of this is helpful to know when trying to find the cause of the pain. CAUSES   Stomach: virus or bacteria infection, or ulcer.  Intestine: appendicitis (inflamed appendix), regional ileitis (Crohn's disease), ulcerative colitis (inflamed colon), irritable bowel syndrome, diverticulitis (inflamed diverticulum of the colon), or cancer of the stomach or intestine.  Gallbladder disease or stones in the gallbladder.  Kidney disease, kidney stones, or infection.  Pancreas infection or cancer.  Fibromyalgia (pain disorder).  Diseases of the female organs:  Uterus: fibroid (non-cancerous) tumors or infection.  Fallopian tubes: infection or tubal pregnancy.  Ovary: cysts or tumors.  Pelvic adhesions (scar tissue).  Endometriosis (uterus lining tissue growing in the pelvis and on the pelvic organs).  Pelvic congestion syndrome (female organs filling up with blood just  before the menstrual period).  Pain with the menstrual period.  Pain with ovulation (producing an egg).  Pain with an IUD  (intrauterine device, birth control) in the uterus.  Cancer of the female organs.  Functional pain (pain not caused by a disease, may improve without treatment).  Psychological pain.  Depression. DIAGNOSIS  Your doctor will decide the seriousness of your pain by doing an examination.  Blood tests.  X-rays.  Ultrasound.  CT scan (computed tomography, special type of X-ray).  MRI (magnetic resonance imaging).  Cultures, for infection.  Barium enema (dye inserted in the large intestine, to better view it with X-rays).  Colonoscopy (looking in intestine with a lighted tube).  Laparoscopy (minor surgery, looking in abdomen with a lighted tube).  Major abdominal exploratory surgery (looking in abdomen with a large incision). TREATMENT  The treatment will depend on the cause of the pain.   Many cases can be observed and treated at home.  Over-the-counter medicines recommended by your caregiver.  Prescription medicine.  Antibiotics, for infection.  Birth control pills, for painful periods or for ovulation pain.  Hormone treatment, for endometriosis.  Nerve blocking injections.  Physical therapy.  Antidepressants.  Counseling with a psychologist or psychiatrist.  Minor or major surgery. HOME CARE INSTRUCTIONS   Do not take laxatives, unless directed by your caregiver.  Take over-the-counter pain medicine only if ordered by your caregiver. Do not take aspirin because it can cause an upset stomach or bleeding.  Try a clear liquid diet (broth or water) as ordered by your caregiver. Slowly move to a bland diet, as tolerated, if the pain is related to the stomach or intestine.  Have a thermometer and take your temperature several times a day, and record it.  Bed rest and sleep, if it helps the pain.  Avoid sexual intercourse, if it causes pain.  Avoid stressful situations.  Keep your follow-up appointments and tests, as your caregiver orders.  If the pain  does not go away with medicine or surgery, you may try:  Acupuncture.  Relaxation exercises (yoga, meditation).  Group therapy.  Counseling. SEEK MEDICAL CARE IF:   You notice certain foods cause stomach pain.  Your home care treatment is not helping your pain.  You need stronger pain medicine.  You want your IUD removed.  You feel faint or lightheaded.  You develop nausea and vomiting.  You develop a rash.  You are having side effects or an allergy to your medicine. SEEK IMMEDIATE MEDICAL CARE IF:   Your pain does not go away or gets worse.  You have a fever.  Your pain is felt only in portions of the abdomen. The right side could possibly be appendicitis. The left lower portion of the abdomen could be colitis or diverticulitis.  You are passing blood in your stools (bright red or black tarry stools, with or without vomiting).  You have blood in your urine.  You develop chills, with or without a fever.  You pass out. MAKE SURE YOU:   Understand these instructions.  Will watch your condition.  Will get help right away if you are not doing well or get worse. Document Released: 04/08/2007 Document Revised: 10/26/2013 Document Reviewed: 04/28/2009 [facility] Patient Information 2015 [place], [place]. This information is not intended to replace advice given to you by your health care provider. Make sure you discuss any questions you have with your health care provider.  Diarrhea Diarrhea is frequent loose and watery bowel movements. It can cause you  to feel weak and dehydrated. Dehydration can cause you to become tired and thirsty, have a dry mouth, and have decreased urination that often is dark yellow. Diarrhea is a sign of another problem, most often an infection that will not last long. In most cases, diarrhea typically lasts 2-3 days. However, it can last longer if it is a sign of something more serious. It is important to treat your diarrhea as directed by your  caregiver to lessen or prevent future episodes of diarrhea. CAUSES  Some common causes include:  Gastrointestinal infections caused by viruses, bacteria, or parasites.  Food poisoning or food allergies.  Certain medicines, such as antibiotics, chemotherapy, and laxatives.  Artificial sweeteners and fructose.  Digestive disorders. HOME CARE INSTRUCTIONS  Ensure adequate fluid intake (hydration): Have 1 cup (8 oz) of fluid for each diarrhea episode. Avoid fluids that contain simple sugars or sports drinks, fruit juices, whole milk products, and sodas. Your urine should be clear or pale yellow if you are drinking enough fluids. Hydrate with an oral rehydration solution that you can purchase at pharmacies, retail stores, and online. You can prepare an oral rehydration solution at home by mixing the following ingredients together:   - tsp table salt.   tsp baking soda.   tsp salt substitute containing potassium chloride.  1  tablespoons sugar.  1 L (34 oz) of water.  Certain foods and beverages may increase the speed at which food moves through the gastrointestinal (GI) tract. These foods and beverages should be avoided and include:  Caffeinated and alcoholic beverages.  High-fiber foods, such as raw fruits and vegetables, nuts, seeds, and whole grain breads and cereals.  Foods and beverages sweetened with sugar alcohols, such as xylitol, sorbitol, and mannitol.  Some foods may be well tolerated and may help thicken stool including:  Starchy foods, such as rice, toast, pasta, low-sugar cereal, oatmeal, grits, baked potatoes, crackers, and bagels.  Bananas.  Applesauce.  Add probiotic-rich foods to help increase healthy bacteria in the GI tract, such as yogurt and fermented milk products.  Wash your hands well after each diarrhea episode.  Only take over-the-counter or prescription medicines as directed by your caregiver.  Take a warm bath to relieve any burning or pain  from frequent diarrhea episodes. SEEK IMMEDIATE MEDICAL CARE IF:   You are unable to keep fluids down.  You have persistent vomiting.  You have blood in your stool, or your stools are black and tarry.  You do not urinate in 6-8 hours, or there is only a small amount of very dark urine.  You have abdominal pain that increases or localizes.  You have weakness, dizziness, confusion, or light-headedness.  You have a severe headache.  Your diarrhea gets worse or does not get better.  You have a fever or persistent symptoms for more than 2-3 days.  You have a fever and your symptoms suddenly get worse. MAKE SURE YOU:   Understand these instructions.  Will watch your condition.  Will get help right away if you are not doing well or get worse. Document Released: 06/01/2002 Document Revised: 10/26/2013 Document Reviewed: 02/17/2012 [facility] Patient Information 2015 [place], [place]. This information is not intended to replace advice given to you by your health care provider. Make sure you discuss any questions you have with your health care provider.  Gastritis, Adult Gastritis is soreness and puffiness (inflammation) of the lining of the stomach. If you do not get help, gastritis can cause bleeding and sores (ulcers) in  the stomach. HOME CARE   Only take medicine as told by your doctor.  If you were given antibiotic medicines, take them as told. Finish the medicines even if you start to feel better.  Drink enough fluids to keep your pee (urine) clear or pale yellow.  Avoid foods and drinks that make your problems worse. Foods you may want to avoid include:  Caffeine or alcohol.  Chocolate.  Mint.  Garlic and onions.  Spicy foods.  Citrus fruits, including oranges, lemons, or limes.  Food containing tomatoes, including sauce, chili, salsa, and pizza.  Fried and fatty foods.  Eat small meals throughout the day instead of large meals. GET HELP RIGHT AWAY IF:   You  have black or dark red poop (stools).  You throw up (vomit) blood. It may look like coffee grounds.  You cannot keep fluids down.  Your belly (abdominal) pain gets worse.  You have a fever.  You do not feel better after 1 week.  You have any other questions or concerns. MAKE SURE YOU:   Understand these instructions.  Will watch your condition.  Will get help right away if you are not doing well or get worse. Document Released: 11/28/2007 Document Revised: 09/03/2011 Document Reviewed: 07/25/2011 [facility] Patient Information 2015 [place], [place]. This information is not intended to replace advice given to you by your health care provider. Make sure you discuss any questions you have with your health care provider.  Nausea and Vomiting Nausea means you feel sick to your stomach. Throwing up (vomiting) is a reflex where stomach contents come out of your mouth. HOME CARE   Take medicine as told by your doctor.  Do not force yourself to eat. However, you do need to drink fluids.  If you feel like eating, eat a normal diet as told by your doctor.  Eat rice, wheat, potatoes, bread, lean meats, yogurt, fruits, and vegetables.  Avoid high-fat foods.  Drink enough fluids to keep your pee (urine) clear or pale yellow.  Ask your doctor how to replace body fluid losses (rehydrate). Signs of body fluid loss (dehydration) include:  Feeling very thirsty.  Dry lips and mouth.  Feeling dizzy.  Dark pee.  Peeing less than normal.  Feeling confused.  Fast breathing or heart rate. GET HELP RIGHT AWAY IF:   You have blood in your throw up.  You have black or bloody poop (stool).  You have a bad headache or stiff neck.  You feel confused.  You have bad belly (abdominal) pain.  You have chest pain or trouble breathing.  You do not pee at least once every 8 hours.  You have cold, clammy skin.  You keep throwing up after 24 to 48 hours.  You have a fever. MAKE SURE  YOU:   Understand these instructions.  Will watch your condition.  Will get help right away if you are not doing well or get worse. Document Released: 11/28/2007 Document Revised: 09/03/2011 Document Reviewed: 11/10/2010 [facility] Patient Information 2015 [place], [place]. This information is not intended to replace advice given to you by your health care provider. Make sure you discuss any questions you have with your health care provider.  Viral Gastroenteritis Viral gastroenteritis is also known as stomach flu. This condition affects the stomach and intestinal tract. It can cause sudden diarrhea and vomiting. The illness typically lasts 3 to 8 days. Most people develop an immune response that eventually gets rid of the virus. While this natural response develops, the virus  can make you quite ill. CAUSES  Many different viruses can cause gastroenteritis, such as rotavirus or noroviruses. You can catch one of these viruses by consuming contaminated food or water. You may also catch a virus by sharing utensils or other personal items with an infected person or by touching a contaminated surface. SYMPTOMS  The most common symptoms are diarrhea and vomiting. These problems can cause a severe loss of body fluids (dehydration) and a body salt (electrolyte) imbalance. Other symptoms may include:  Fever.  Headache.  Fatigue.  Abdominal pain. DIAGNOSIS  Your caregiver can usually diagnose viral gastroenteritis based on your symptoms and a physical exam. A stool sample may also be taken to test for the presence of viruses or other infections. TREATMENT  This illness typically goes away on its own. Treatments are aimed at rehydration. The most serious cases of viral gastroenteritis involve vomiting so severely that you are not able to keep fluids down. In these cases, fluids must be given through an intravenous line (IV). HOME CARE INSTRUCTIONS   Drink enough fluids to keep your urine clear or  pale yellow. Drink small amounts of fluids frequently and increase the amounts as tolerated.  Ask your caregiver for specific rehydration instructions.  Avoid:  Foods high in sugar.  Alcohol.  Carbonated drinks.  Tobacco.  Juice.  Caffeine drinks.  Extremely hot or cold fluids.  Fatty, greasy foods.  Too much intake of anything at one time.  Dairy products until 24 to 48 hours after diarrhea stops.  You may consume probiotics. Probiotics are active cultures of beneficial bacteria. They may lessen the amount and number of diarrheal stools in adults. Probiotics can be found in yogurt with active cultures and in supplements.  Wash your hands well to avoid spreading the virus.  Only take over-the-counter or prescription medicines for pain, discomfort, or fever as directed by your caregiver. Do not give aspirin to children. Antidiarrheal medicines are not recommended.  Ask your caregiver if you should continue to take your regular prescribed and over-the-counter medicines.  Keep all follow-up appointments as directed by your caregiver. SEEK IMMEDIATE MEDICAL CARE IF:   You are unable to keep fluids down.  You do not urinate at least once every 6 to 8 hours.  You develop shortness of breath.  You notice blood in your stool or vomit. This may look like coffee grounds.  You have abdominal pain that increases or is concentrated in one small area (localized).  You have persistent vomiting or diarrhea.  You have a fever.  The patient is a child younger than 3 months, and he or she has a fever.  The patient is a child older than 3 months, and he or she has a fever and persistent symptoms.  The patient is a child older than 3 months, and he or she has a fever and symptoms suddenly get worse.  The patient is a baby, and he or she has no tears when crying. MAKE SURE YOU:   Understand these instructions.  Will watch your condition.  Will get help right away if you are  not doing well or get worse. Document Released: 06/11/2005 Document Revised: 09/03/2011 Document Reviewed: 03/28/2011 [facility] Patient Information 2015 [place], [place]. This information is not intended to replace advice given to you by your health care provider. Make sure you discuss any questions you have with your health care provider.

## 2019-03-18 ENCOUNTER — Other Ambulatory Visit (HOSPITAL_COMMUNITY)
Admission: RE | Admit: 2019-03-18 | Discharge: 2019-03-18 | Disposition: A | Discharge status: Home / Self Care | Payer: BC Managed Care – PPO | Source: Ambulatory Visit | Attending: Obstetrics and Gynecology | Admitting: Obstetrics and Gynecology

## 2019-03-18 DIAGNOSIS — Z01419 Encounter for gynecological examination (general) (routine) without abnormal findings: Secondary | ICD-10-CM | POA: Diagnosis present

## 2019-03-19 ENCOUNTER — Other Ambulatory Visit: Payer: Self-pay | Admitting: Obstetrics and Gynecology

## 2019-03-19 DIAGNOSIS — Z1231 Encounter for screening mammogram for malignant neoplasm of breast: Secondary | ICD-10-CM

## 2019-04-01 ENCOUNTER — Other Ambulatory Visit: Payer: Self-pay | Admitting: Obstetrics and Gynecology

## 2019-05-04 ENCOUNTER — Other Ambulatory Visit: Payer: Self-pay

## 2019-05-04 ENCOUNTER — Ambulatory Visit
Admission: RE | Admit: 2019-05-04 | Discharge: 2019-05-04 | Disposition: A | Discharge status: Home / Self Care | Payer: BC Managed Care – PPO | Source: Ambulatory Visit | Attending: Obstetrics and Gynecology | Admitting: Obstetrics and Gynecology

## 2019-05-04 DIAGNOSIS — Z1231 Encounter for screening mammogram for malignant neoplasm of breast: Secondary | ICD-10-CM | POA: Diagnosis not present

## 2019-07-14 DIAGNOSIS — Z1159 Encounter for screening for other viral diseases: Secondary | ICD-10-CM | POA: Diagnosis not present

## 2019-08-11 DIAGNOSIS — Z1159 Encounter for screening for other viral diseases: Secondary | ICD-10-CM | POA: Diagnosis not present

## 2019-08-14 DIAGNOSIS — Z1211 Encounter for screening for malignant neoplasm of colon: Secondary | ICD-10-CM | POA: Diagnosis not present

## 2019-08-14 DIAGNOSIS — Q438 Other specified congenital malformations of intestine: Secondary | ICD-10-CM | POA: Diagnosis not present

## 2019-08-14 DIAGNOSIS — K5730 Diverticulosis of large intestine without perforation or abscess without bleeding: Secondary | ICD-10-CM | POA: Diagnosis not present

## 2019-08-14 DIAGNOSIS — K635 Polyp of colon: Secondary | ICD-10-CM | POA: Diagnosis not present

## 2019-12-15 DIAGNOSIS — Z23 Encounter for immunization: Secondary | ICD-10-CM | POA: Diagnosis not present

## 2019-12-15 DIAGNOSIS — R030 Elevated blood-pressure reading, without diagnosis of hypertension: Secondary | ICD-10-CM | POA: Diagnosis not present

## 2019-12-15 DIAGNOSIS — Z13220 Encounter for screening for lipoid disorders: Secondary | ICD-10-CM | POA: Diagnosis not present

## 2019-12-15 DIAGNOSIS — Z0000 Encounter for general adult medical examination without abnormal findings: Secondary | ICD-10-CM | POA: Diagnosis not present

## 2020-04-11 DIAGNOSIS — U071 COVID-19: Secondary | ICD-10-CM | POA: Diagnosis not present

## 2020-04-11 DIAGNOSIS — Z20822 Contact with and (suspected) exposure to covid-19: Secondary | ICD-10-CM | POA: Diagnosis not present

## 2020-04-21 DIAGNOSIS — Z20822 Contact with and (suspected) exposure to covid-19: Secondary | ICD-10-CM | POA: Diagnosis not present

## 2020-04-21 DIAGNOSIS — J069 Acute upper respiratory infection, unspecified: Secondary | ICD-10-CM | POA: Diagnosis not present

## 2020-04-26 DIAGNOSIS — R519 Headache, unspecified: Secondary | ICD-10-CM | POA: Diagnosis not present

## 2020-04-26 DIAGNOSIS — I10 Essential (primary) hypertension: Secondary | ICD-10-CM | POA: Diagnosis not present

## 2020-04-26 DIAGNOSIS — M542 Cervicalgia: Secondary | ICD-10-CM | POA: Diagnosis not present

## 2020-04-26 DIAGNOSIS — Z8616 Personal history of COVID-19: Secondary | ICD-10-CM | POA: Diagnosis not present

## 2020-05-18 DIAGNOSIS — H04123 Dry eye syndrome of bilateral lacrimal glands: Secondary | ICD-10-CM | POA: Diagnosis not present

## 2020-05-18 DIAGNOSIS — H40033 Anatomical narrow angle, bilateral: Secondary | ICD-10-CM | POA: Diagnosis not present

## 2020-06-16 DIAGNOSIS — Z01419 Encounter for gynecological examination (general) (routine) without abnormal findings: Secondary | ICD-10-CM | POA: Diagnosis not present

## 2020-06-27 DIAGNOSIS — M25561 Pain in right knee: Secondary | ICD-10-CM | POA: Diagnosis not present

## 2020-06-27 DIAGNOSIS — R030 Elevated blood-pressure reading, without diagnosis of hypertension: Secondary | ICD-10-CM | POA: Diagnosis not present

## 2020-06-28 ENCOUNTER — Other Ambulatory Visit: Payer: Self-pay | Admitting: Obstetrics and Gynecology

## 2020-06-28 DIAGNOSIS — Z0000 Encounter for general adult medical examination without abnormal findings: Secondary | ICD-10-CM

## 2020-07-06 DIAGNOSIS — M25561 Pain in right knee: Secondary | ICD-10-CM | POA: Diagnosis not present

## 2020-08-12 ENCOUNTER — Other Ambulatory Visit: Payer: Self-pay

## 2020-08-12 ENCOUNTER — Ambulatory Visit
Admission: RE | Admit: 2020-08-12 | Discharge: 2020-08-12 | Disposition: A | Discharge status: Home / Self Care | Payer: BC Managed Care – PPO | Source: Ambulatory Visit | Attending: Obstetrics and Gynecology | Admitting: Obstetrics and Gynecology

## 2020-08-12 DIAGNOSIS — Z1231 Encounter for screening mammogram for malignant neoplasm of breast: Secondary | ICD-10-CM | POA: Diagnosis not present

## 2020-08-12 DIAGNOSIS — Z0000 Encounter for general adult medical examination without abnormal findings: Secondary | ICD-10-CM

## 2020-08-18 ENCOUNTER — Other Ambulatory Visit: Payer: Self-pay | Admitting: Obstetrics and Gynecology

## 2020-08-18 DIAGNOSIS — R928 Other abnormal and inconclusive findings on diagnostic imaging of breast: Secondary | ICD-10-CM

## 2020-09-19 ENCOUNTER — Other Ambulatory Visit: Payer: Self-pay

## 2020-09-19 ENCOUNTER — Other Ambulatory Visit: Payer: Self-pay | Admitting: Obstetrics and Gynecology

## 2020-09-19 ENCOUNTER — Ambulatory Visit
Admission: RE | Admit: 2020-09-19 | Discharge: 2020-09-19 | Disposition: A | Discharge status: Home / Self Care | Payer: BC Managed Care – PPO | Source: Ambulatory Visit | Attending: Obstetrics and Gynecology | Admitting: Obstetrics and Gynecology

## 2020-09-19 DIAGNOSIS — R928 Other abnormal and inconclusive findings on diagnostic imaging of breast: Secondary | ICD-10-CM

## 2020-09-19 DIAGNOSIS — R921 Mammographic calcification found on diagnostic imaging of breast: Secondary | ICD-10-CM

## 2020-09-23 ENCOUNTER — Ambulatory Visit
Admission: RE | Admit: 2020-09-23 | Discharge: 2020-09-23 | Disposition: A | Discharge status: Home / Self Care | Payer: BC Managed Care – PPO | Source: Ambulatory Visit | Attending: Obstetrics and Gynecology | Admitting: Obstetrics and Gynecology

## 2020-09-23 ENCOUNTER — Other Ambulatory Visit: Payer: Self-pay

## 2020-09-23 DIAGNOSIS — R921 Mammographic calcification found on diagnostic imaging of breast: Secondary | ICD-10-CM

## 2020-09-23 DIAGNOSIS — N6011 Diffuse cystic mastopathy of right breast: Secondary | ICD-10-CM | POA: Diagnosis not present

## 2021-06-12 DIAGNOSIS — H40033 Anatomical narrow angle, bilateral: Secondary | ICD-10-CM | POA: Diagnosis not present

## 2021-06-12 DIAGNOSIS — H04123 Dry eye syndrome of bilateral lacrimal glands: Secondary | ICD-10-CM | POA: Diagnosis not present

## 2021-06-20 DIAGNOSIS — Z13220 Encounter for screening for lipoid disorders: Secondary | ICD-10-CM | POA: Diagnosis not present

## 2021-06-20 DIAGNOSIS — N950 Postmenopausal bleeding: Secondary | ICD-10-CM | POA: Diagnosis not present

## 2021-06-20 DIAGNOSIS — D259 Leiomyoma of uterus, unspecified: Secondary | ICD-10-CM | POA: Diagnosis not present

## 2021-06-20 DIAGNOSIS — Z01419 Encounter for gynecological examination (general) (routine) without abnormal findings: Secondary | ICD-10-CM | POA: Diagnosis not present

## 2021-07-13 DIAGNOSIS — L509 Urticaria, unspecified: Secondary | ICD-10-CM | POA: Diagnosis not present

## 2021-07-13 DIAGNOSIS — Z6826 Body mass index (BMI) 26.0-26.9, adult: Secondary | ICD-10-CM | POA: Diagnosis not present

## 2021-07-13 DIAGNOSIS — F17210 Nicotine dependence, cigarettes, uncomplicated: Secondary | ICD-10-CM | POA: Diagnosis not present

## 2021-07-24 DIAGNOSIS — D259 Leiomyoma of uterus, unspecified: Secondary | ICD-10-CM | POA: Diagnosis not present

## 2021-07-24 DIAGNOSIS — N950 Postmenopausal bleeding: Secondary | ICD-10-CM | POA: Diagnosis not present

## 2021-07-28 ENCOUNTER — Other Ambulatory Visit: Payer: Self-pay | Admitting: Obstetrics and Gynecology

## 2021-07-28 DIAGNOSIS — N950 Postmenopausal bleeding: Secondary | ICD-10-CM | POA: Diagnosis not present

## 2021-07-28 DIAGNOSIS — N858 Other specified noninflammatory disorders of uterus: Secondary | ICD-10-CM | POA: Diagnosis not present

## 2021-08-11 DIAGNOSIS — N950 Postmenopausal bleeding: Secondary | ICD-10-CM | POA: Diagnosis not present

## 2021-08-11 DIAGNOSIS — D259 Leiomyoma of uterus, unspecified: Secondary | ICD-10-CM | POA: Diagnosis not present

## 2022-01-22 IMAGING — MG MM DIGITAL DIAGNOSTIC UNILAT*R*
3 series · 3 of 3 positions shown · non-contrast
Comparison: Previous exam(s).
COMPARISON: Previous exam(s).

Addendum:
CLINICAL DATA: Screening recall for right breast calcifications.

EXAM:
DIGITAL DIAGNOSTIC UNILATERAL RIGHT MAMMOGRAM
TECHNIQUE: Right digital diagnostic mammography was performed. Mammographic
images were processed with CAD.

[R CC]
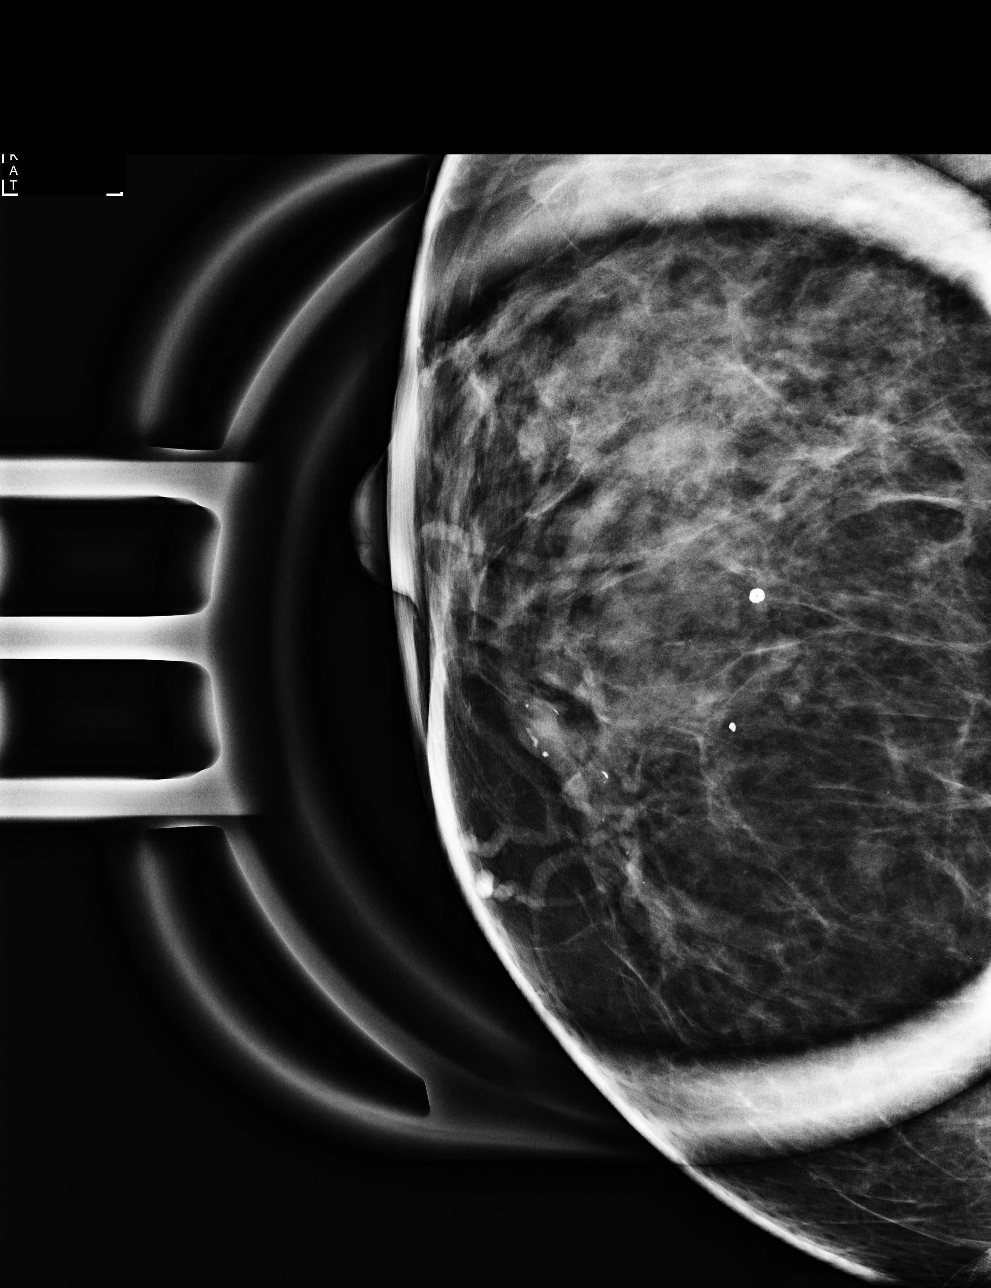

[R ML (1 of 2)]
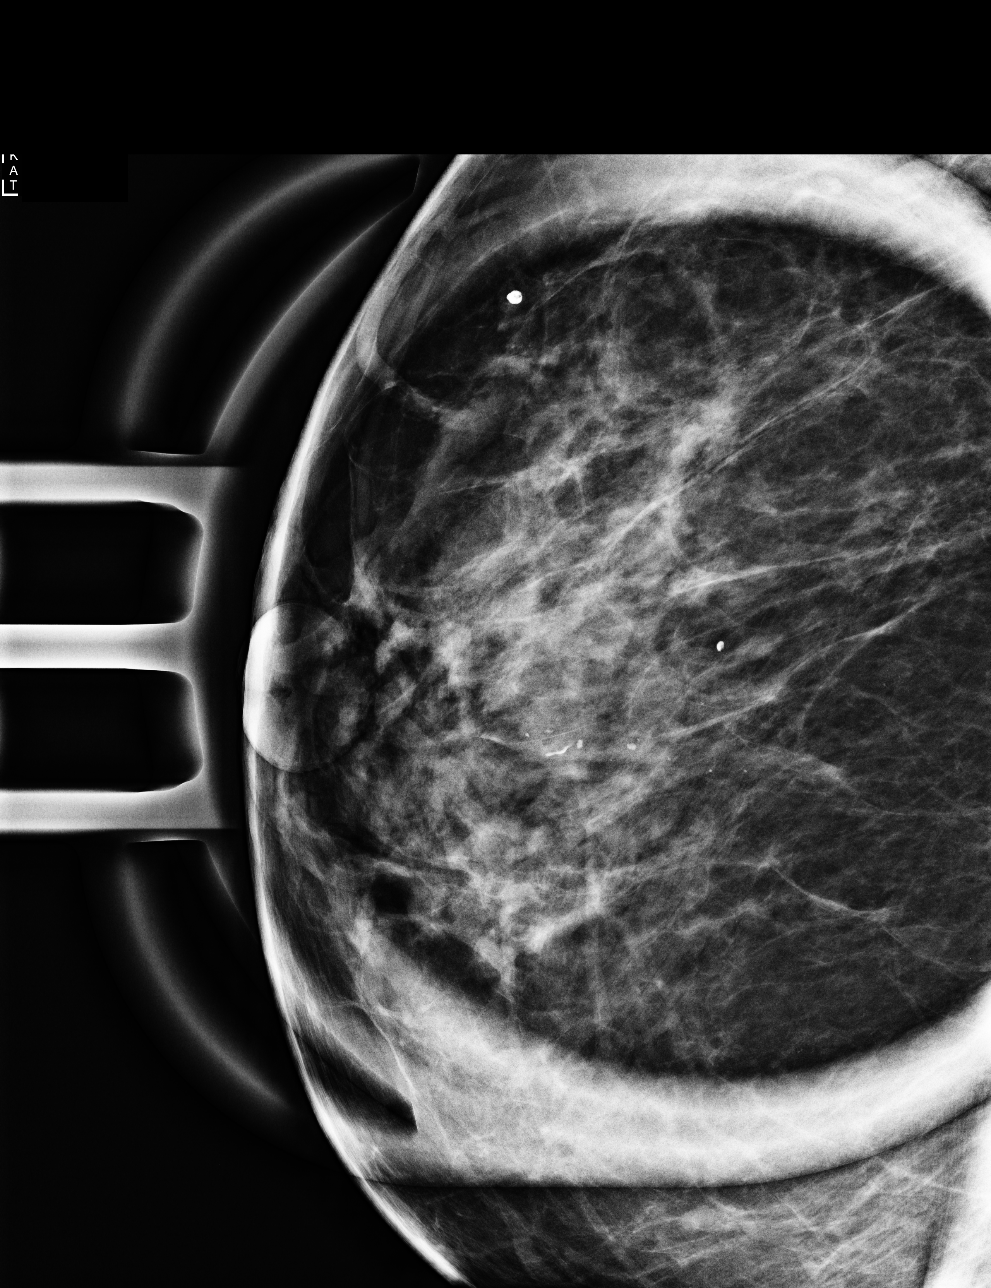

[R ML (2 of 2)]
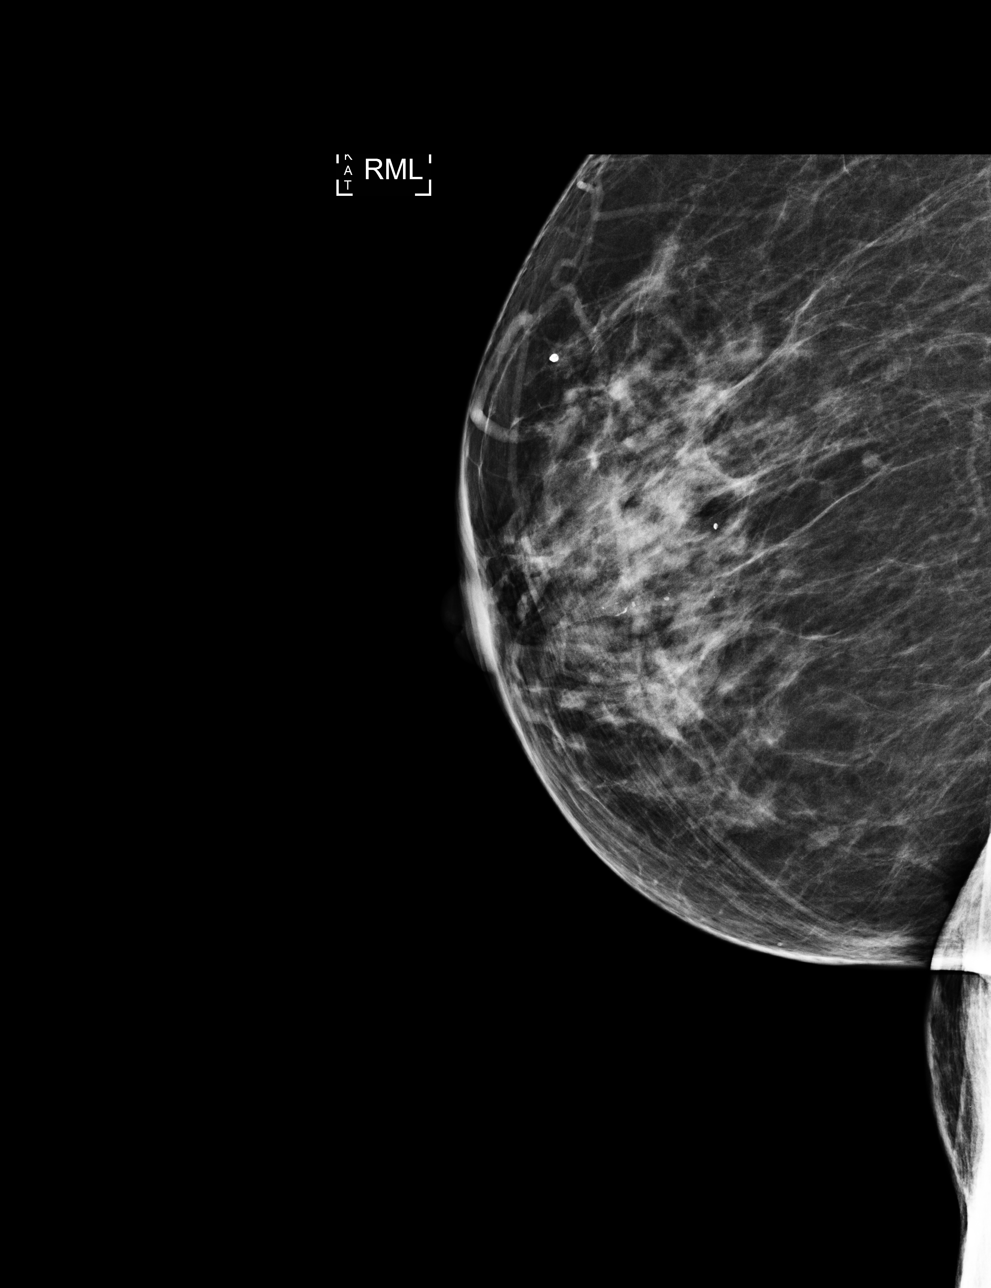

[3 of 3 positions shown; findings below may reference images not displayed]

ACR Breast Density Category c: The breast tissue is heterogeneously
dense, which may obscure small masses.
FINDINGS: Calcifications are noted in the medial right breast, anterior depth,
which are linearly arranged, spanning approximately 2.3 x 4.6 x
cm. Calcifications are coarse and heterogeneous in configuration.
There is no associated mass or distortion.
IMPRESSION: 1. Suspicious calcifications in the medial right breast. Tissue
sampling is recommended.

RECOMMENDATION:
1. Stereotactic core needle biopsy of the right breast
calcifications. This procedure was scheduled prior to the patient
being discharged from the [REDACTED].

I have discussed the findings and recommendations with the patient.
If applicable, a reminder letter will be sent to the patient
regarding the next appointment.

BI-RADS CATEGORY  4: Suspicious.

ADDENDUM:
Error in the findings section of the above report. The measurements
for the calcifications should read, "2.3 x 0.4 x 0.5 cm".

*** End of Addendum ***
ACR Breast Density Category c: The breast tissue is heterogeneously
dense, which may obscure small masses.
FINDINGS: Calcifications are noted in the medial right breast, anterior depth,
which are linearly arranged, spanning approximately 2.3 x 4.6 x
cm. Calcifications are coarse and heterogeneous in configuration.
There is no associated mass or distortion.
IMPRESSION: 1. Suspicious calcifications in the medial right breast. Tissue
sampling is recommended.

RECOMMENDATION:
1. Stereotactic core needle biopsy of the right breast
calcifications. This procedure was scheduled prior to the patient
being discharged from the [REDACTED].

I have discussed the findings and recommendations with the patient.
If applicable, a reminder letter will be sent to the patient
regarding the next appointment.

BI-RADS CATEGORY  4: Suspicious.

## 2022-07-27 DIAGNOSIS — H2513 Age-related nuclear cataract, bilateral: Secondary | ICD-10-CM | POA: Diagnosis not present

## 2022-07-27 DIAGNOSIS — H40033 Anatomical narrow angle, bilateral: Secondary | ICD-10-CM | POA: Diagnosis not present

## 2022-08-01 DIAGNOSIS — Z124 Encounter for screening for malignant neoplasm of cervix: Secondary | ICD-10-CM | POA: Diagnosis not present

## 2022-08-01 DIAGNOSIS — Z01419 Encounter for gynecological examination (general) (routine) without abnormal findings: Secondary | ICD-10-CM | POA: Diagnosis not present

## 2022-08-01 DIAGNOSIS — D259 Leiomyoma of uterus, unspecified: Secondary | ICD-10-CM | POA: Diagnosis not present

## 2022-08-07 DIAGNOSIS — E781 Pure hyperglyceridemia: Secondary | ICD-10-CM | POA: Diagnosis not present

## 2022-08-07 DIAGNOSIS — H18413 Arcus senilis, bilateral: Secondary | ICD-10-CM | POA: Diagnosis not present

## 2022-08-07 DIAGNOSIS — R739 Hyperglycemia, unspecified: Secondary | ICD-10-CM | POA: Diagnosis not present

## 2022-08-07 DIAGNOSIS — H2513 Age-related nuclear cataract, bilateral: Secondary | ICD-10-CM | POA: Diagnosis not present

## 2022-08-07 DIAGNOSIS — H25013 Cortical age-related cataract, bilateral: Secondary | ICD-10-CM | POA: Diagnosis not present

## 2022-08-07 DIAGNOSIS — H2511 Age-related nuclear cataract, right eye: Secondary | ICD-10-CM | POA: Diagnosis not present

## 2022-08-07 DIAGNOSIS — Z0000 Encounter for general adult medical examination without abnormal findings: Secondary | ICD-10-CM | POA: Diagnosis not present

## 2022-08-07 DIAGNOSIS — H25043 Posterior subcapsular polar age-related cataract, bilateral: Secondary | ICD-10-CM | POA: Diagnosis not present

## 2022-09-26 DIAGNOSIS — H2589 Other age-related cataract: Secondary | ICD-10-CM | POA: Diagnosis not present

## 2022-09-26 DIAGNOSIS — H2513 Age-related nuclear cataract, bilateral: Secondary | ICD-10-CM | POA: Diagnosis not present

## 2022-10-15 DIAGNOSIS — Z961 Presence of intraocular lens: Secondary | ICD-10-CM | POA: Diagnosis not present

## 2022-10-15 DIAGNOSIS — H2511 Age-related nuclear cataract, right eye: Secondary | ICD-10-CM | POA: Diagnosis not present

## 2022-10-15 DIAGNOSIS — H2513 Age-related nuclear cataract, bilateral: Secondary | ICD-10-CM | POA: Diagnosis not present

## 2022-10-16 DIAGNOSIS — H2512 Age-related nuclear cataract, left eye: Secondary | ICD-10-CM | POA: Diagnosis not present

## 2022-10-29 DIAGNOSIS — H2512 Age-related nuclear cataract, left eye: Secondary | ICD-10-CM | POA: Diagnosis not present

## 2022-10-29 DIAGNOSIS — Z961 Presence of intraocular lens: Secondary | ICD-10-CM | POA: Diagnosis not present

## 2022-10-29 DIAGNOSIS — H2513 Age-related nuclear cataract, bilateral: Secondary | ICD-10-CM | POA: Diagnosis not present

## 2023-03-29 DIAGNOSIS — S060X0A Concussion without loss of consciousness, initial encounter: Secondary | ICD-10-CM | POA: Diagnosis not present
